# Patient Record
Sex: Male | Born: 1995 | Race: Black or African American | Hispanic: No | Marital: Single | Smoking: Former smoker
Health system: Southern US, Community
[De-identification: ages and names within clinical notes are randomized; demographics above are authoritative.]

---
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
## 2016-05-19 ENCOUNTER — Emergency Department (HOSPITAL_COMMUNITY)
Admission: EM | Admit: 2016-05-19 | Discharge: 2016-05-19 | Disposition: A | Discharge status: Home / Self Care | Payer: Managed Care, Other (non HMO) | Attending: Emergency Medicine | Admitting: Emergency Medicine

## 2016-05-19 ENCOUNTER — Emergency Department (HOSPITAL_COMMUNITY): Payer: Managed Care, Other (non HMO)

## 2016-05-19 ENCOUNTER — Encounter (HOSPITAL_COMMUNITY): Payer: Self-pay | Admitting: Emergency Medicine

## 2016-05-19 DIAGNOSIS — E860 Dehydration: Secondary | ICD-10-CM

## 2016-05-19 DIAGNOSIS — R5383 Other fatigue: Secondary | ICD-10-CM

## 2016-05-19 DIAGNOSIS — J45909 Unspecified asthma, uncomplicated: Secondary | ICD-10-CM | POA: Insufficient documentation

## 2016-05-19 DIAGNOSIS — F17200 Nicotine dependence, unspecified, uncomplicated: Secondary | ICD-10-CM | POA: Insufficient documentation

## 2016-05-19 DIAGNOSIS — Z79899 Other long term (current) drug therapy: Secondary | ICD-10-CM | POA: Insufficient documentation

## 2016-05-19 LAB — RAPID URINE DRUG SCREEN, HOSP PERFORMED
Amphetamines: NOT DETECTED
BENZODIAZEPINES: NOT DETECTED
Barbiturates: NOT DETECTED
COCAINE: POSITIVE — AB
Opiates: NOT DETECTED
Tetrahydrocannabinol: POSITIVE — AB

## 2016-05-19 LAB — CBC WITH DIFFERENTIAL/PLATELET
BASOS ABS: 0 10*3/uL (ref 0.0–0.1)
BASOS PCT: 0 %
EOS PCT: 1 %
Eosinophils Absolute: 0 10*3/uL (ref 0.0–0.7)
HCT: 39.8 % (ref 39.0–52.0)
Hemoglobin: 13.4 g/dL (ref 13.0–17.0)
Lymphocytes Relative: 19 %
Lymphs Abs: 0.8 10*3/uL (ref 0.7–4.0)
MCH: 28.4 pg (ref 26.0–34.0)
MCHC: 33.7 g/dL (ref 30.0–36.0)
MCV: 84.3 fL (ref 78.0–100.0)
MONO ABS: 0.4 10*3/uL (ref 0.1–1.0)
Monocytes Relative: 9 %
Neutro Abs: 2.8 10*3/uL (ref 1.7–7.7)
Neutrophils Relative %: 71 %
PLATELETS: 225 10*3/uL (ref 150–400)
RBC: 4.72 MIL/uL (ref 4.22–5.81)
RDW: 13.2 % (ref 11.5–15.5)
WBC: 4 10*3/uL (ref 4.0–10.5)

## 2016-05-19 LAB — BASIC METABOLIC PANEL
Anion gap: 7 (ref 5–15)
BUN: 16 mg/dL (ref 6–20)
CALCIUM: 8.9 mg/dL (ref 8.9–10.3)
CO2: 26 mmol/L (ref 22–32)
Chloride: 107 mmol/L (ref 101–111)
Creatinine, Ser: 1.05 mg/dL (ref 0.61–1.24)
GFR calc Af Amer: >60 mL/min (ref >60)
GLUCOSE: 97 mg/dL (ref 65–99)
Potassium: 3.6 mmol/L (ref 3.5–5.1)
Sodium: 140 mmol/L (ref 135–145)

## 2016-05-19 NOTE — ED Notes (Signed)
Upon assessment pt sts "I just feel like I'm in a daze and my throat is closing up." PT is speaking in complete sentences and denies SOB. Pt was supposed to start his first day of work today.

## 2016-05-19 NOTE — ED Triage Notes (Signed)
Pt from home c/o feeling dehydrated. Pt sts he had nothing to drink yesterday. Pt had positive orthostatic changes. Pt was tachy for EMS but is now Normal sinus. Pt ambulatory without difficulty. Pt in no distress. Pt has 20 L forearm, fluids have been started en route.

## 2016-05-19 NOTE — Discharge Instructions (Signed)
Your symptoms are most consistent with a mild dehydration. It is important for you to stay well hydrated and drink at least 8, 8 ounce glasses of water or other electro eyebrow solution daily. Please follow-up with your doctor as needed for reevaluation. Return to ED for new or worsening symptoms.

## 2016-05-19 NOTE — ED Provider Notes (Signed)
[facility]EMERGENCY DEPT Provider Note   CSN: [id number] Arrival date & time: 05/19/16  1039     History   Chief Complaint Chief Complaint  Patient presents with  . Dehydration    HPI Mark Carrillo is a 20 y.o. male.  HPI brought in by mom for evaluation of weakness and dizziness. Patient reports he was starting a new job today and at approximately 9:00 AM "felt like I'm in a daze and my heart is pounding". He does report a remote history of asthma, but denies any chest pain, shortness of breath, abdominal pain, other focal numbness or weakness, vision changes. Mom reports that he has had headaches in the past, but nothing today. Patient also admits that he has not had anything to drink since yesterday. Per EMS he was tachycardic and orthostatic, but after small fluid bolus, vital signs have normalized. No fevers, chills, cough or other medical symptoms. Nothing makes problem better or worse. No other modifying factors.  Past Medical History:  Diagnosis Date  . Asthma     There are no active problems to display for this patient.   History reviewed. No pertinent surgical history.     Home Medications    Prior to Admission medications   Medication Sig Start Date End Date Taking? Authorizing Provider  albuterol (PROVENTIL HFA;VENTOLIN HFA) 108 (90 BASE) MCG/ACT inhaler Inhale 2 puffs into the lungs every 6 (six) hours as needed. For shortness of breath.    Historical Provider, MD    Family History No family history on file.  Social History Social History  Substance Use Topics  . Smoking status: Smoker, Current Status Unknown  . Smokeless tobacco: Not on file  . Alcohol use No     Allergies   Fish allergy   Review of Systems Review of Systems A 10 point review of systems was completed and was negative except for pertinent positives and negatives as mentioned in the history of present illness    Physical Exam Updated Vital Signs BP 119/74 (BP Location: Right Arm)    Pulse 79   Temp 98.9 F (37.2 C)   Resp 18   SpO2 98%   Physical Exam  Constitutional: He is oriented to person, place, and time. He appears well-developed and well-nourished. No distress.  Speaking in full sentences. No apparent distress. Nontoxic in appearance.  HENT:  Head: Normocephalic and atraumatic.  Right Ear: External ear normal.  Left Ear: External ear normal.  Nose: Nose normal.  Mouth/Throat: Oropharynx is clear and moist. No oropharyngeal exudate.  Eyes: Conjunctivae and EOM are normal. Pupils are equal, round, and reactive to light. Right eye exhibits no discharge. Left eye exhibits no discharge. No scleral icterus.  Neck: Normal range of motion. No tracheal deviation present.  No meningismus or nuchal rigidity  Cardiovascular: Normal rate, regular rhythm and normal heart sounds.   Pulmonary/Chest: Effort normal and breath sounds normal. No respiratory distress. He has no wheezes. He has no rales.  Abdominal: Soft. There is no tenderness.  Musculoskeletal: Normal range of motion. He exhibits no edema, tenderness or deformity.  Neurological: He is alert and oriented to person, place, and time. No cranial nerve deficit. He exhibits normal muscle tone. Coordination normal.  Cranial nerves III through XII grossly intact. Motor strength is 5/5 in all 4 extremities and sensation is intact to light touch. Completes finger to nose coordination movements without difficulty. No nystagmus. Gait is baseline without ataxia.  Skin: No rash noted. He is not diaphoretic.  Psychiatric:  He has a normal mood and affect. His behavior is normal. Judgment and thought content normal.  Nursing note and vitals reviewed.    ED Treatments / Results  Labs (all labs ordered are listed, but only abnormal results are displayed) Labs Reviewed  BASIC METABOLIC PANEL  CBC WITH DIFFERENTIAL/PLATELET  RAPID URINE DRUG SCREEN, HOSP PERFORMED    EKG  EKG Interpretation  Date/Time:  Wednesday  May 19 2016 11:47:36 EDT Ventricular Rate:  61 PR Interval:    QRS Duration: 77 QT Interval:  425 QTC Calculation: 429 R Axis:   54 Text Interpretation:  Normal sinus rhythm RSR' in V1 or V2, probably normal variant LVH by voltage Baseline wander in lead(s) V1 No old tracing to compare Confirmed by GOLDSTON MD, SCOTT [phone]([id number]) on 05/19/2016 12:41:19 PM       Radiology No results found.  Procedures Procedures (including critical care time)  Medications Ordered in ED Medications - No data to display  Vitals:   05/19/16 1041 05/19/16 1046 05/19/16 1100 05/19/16 1130  BP:  119/74 130/86 128/70  Pulse:  79 82 68  Resp:  18    Temp:  98.9 F (37.2 C)    SpO2: 100% 98% 99% 100%    Initial Impression / Assessment and Plan / ED Course  I have reviewed the triage vital signs and the nursing notes.  Pertinent labs & imaging results that were available during my care of the patient were reviewed by me and considered in my medical decision making (see chart for details).  Clinical Course    Patient with likely very mild dehydration. Overall appears very well, hemodynamically stable and afebrile. He is able to tolerate oral fluids. Mom is very concerned. We will obtain screening labs, EKG and chest x-ray. Offered reassurance. Reassess.  Screening labs, EKG and chest x-ray are all reassuring. Urine rapid drug screen is positive for THC and cocaine--despite patient denying any use earlier. Discuss results with patient and he admits to eating a cookie this morning that may have had marijuana and/or cocaine in it. Discussed cessation of illicit drug use as this can be detrimental to his health and could be contributory to his current symptoms. Encouraged rest, rehydration at home. PCP follow-up. Strict return precautions discussed. Overall appears well and appropriate for outpatient follow-up.  Final Clinical Impressions(s) / ED Diagnoses   Final diagnoses:  Fatigue, unspecified type    Mild dehydration    New Prescriptions New Prescriptions   No medications on file     Joycie PeekBenjamin Timo Hartwig, PA-C 05/19/16 1341    Joycie PeekBenjamin Prospero Mahnke, PA-C 05/19/16 1341    Pricilla LovelessScott Goldston, MD 05/25/16 1719

## 2016-05-19 NOTE — ED Notes (Signed)
Patient transported to X-ray 

## 2016-11-17 ENCOUNTER — Emergency Department (HOSPITAL_COMMUNITY): Payer: Managed Care, Other (non HMO)

## 2016-11-17 ENCOUNTER — Encounter (HOSPITAL_COMMUNITY): Payer: Self-pay | Admitting: *Deleted

## 2016-11-17 ENCOUNTER — Emergency Department (HOSPITAL_COMMUNITY)
Admission: EM | Admit: 2016-11-17 | Discharge: 2016-11-17 | Disposition: A | Discharge status: Home / Self Care | Payer: Managed Care, Other (non HMO) | Attending: Emergency Medicine | Admitting: Emergency Medicine

## 2016-11-17 DIAGNOSIS — F17200 Nicotine dependence, unspecified, uncomplicated: Secondary | ICD-10-CM | POA: Insufficient documentation

## 2016-11-17 DIAGNOSIS — J45909 Unspecified asthma, uncomplicated: Secondary | ICD-10-CM | POA: Insufficient documentation

## 2016-11-17 DIAGNOSIS — R1031 Right lower quadrant pain: Secondary | ICD-10-CM | POA: Insufficient documentation

## 2016-11-17 DIAGNOSIS — Z79899 Other long term (current) drug therapy: Secondary | ICD-10-CM | POA: Insufficient documentation

## 2016-11-17 LAB — LIPASE, BLOOD: LIPASE: 24 U/L (ref 11–51)

## 2016-11-17 LAB — COMPREHENSIVE METABOLIC PANEL
ALBUMIN: 4.3 g/dL (ref 3.5–5.0)
ALK PHOS: 43 U/L (ref 38–126)
ALT: 19 U/L (ref 17–63)
ANION GAP: 5 (ref 5–15)
AST: 17 U/L (ref 15–41)
BUN: 10 mg/dL (ref 6–20)
CALCIUM: 9.5 mg/dL (ref 8.9–10.3)
CO2: 30 mmol/L (ref 22–32)
Chloride: 107 mmol/L (ref 101–111)
Creatinine, Ser: 0.82 mg/dL (ref 0.61–1.24)
GFR calc Af Amer: >60 mL/min (ref >60)
GFR calc non Af Amer: >60 mL/min (ref >60)
Glucose, Bld: 96 mg/dL (ref 65–99)
POTASSIUM: 4.2 mmol/L (ref 3.5–5.1)
SODIUM: 142 mmol/L (ref 135–145)
Total Bilirubin: 0.6 mg/dL (ref 0.3–1.2)
Total Protein: 6.7 g/dL (ref 6.5–8.1)

## 2016-11-17 LAB — CBC
HEMATOCRIT: 41.3 % (ref 39.0–52.0)
HEMOGLOBIN: 14.2 g/dL (ref 13.0–17.0)
MCH: 28.8 pg (ref 26.0–34.0)
MCHC: 34.4 g/dL (ref 30.0–36.0)
MCV: 83.8 fL (ref 78.0–100.0)
Platelets: 222 10*3/uL (ref 150–400)
RBC: 4.93 MIL/uL (ref 4.22–5.81)
RDW: 12.4 % (ref 11.5–15.5)
WBC: 2.7 10*3/uL — ABNORMAL LOW (ref 4.0–10.5)

## 2016-11-17 MED ORDER — NAPROXEN 250 MG PO TABS: 250.0000 mg | ORAL_TABLET | Freq: Two times a day (BID) | ORAL | 0 refills | Status: DC

## 2016-11-17 MED ORDER — IOPAMIDOL (ISOVUE-300) INJECTION 61%
INTRAVENOUS | Status: AC
  Filled 2016-11-17: qty 75

## 2016-11-17 MED ORDER — ACETAMINOPHEN 325 MG PO TABS
650.0000 mg | ORAL_TABLET | Freq: Once | ORAL | Status: AC
  Administered 2016-11-17: 650 mg via ORAL
  Filled 2016-11-17: qty 2

## 2016-11-17 MED ORDER — SODIUM CHLORIDE 0.9 % IV BOLUS (SEPSIS)
1000.0000 mL | Freq: Once | INTRAVENOUS | Status: AC
  Administered 2016-11-17: 1000 mL via INTRAVENOUS

## 2016-11-17 MED ORDER — IOPAMIDOL (ISOVUE-300) INJECTION 61%
75.0000 mL | Freq: Once | INTRAVENOUS | Status: AC | PRN
  Administered 2016-11-17: 75 mL via INTRAVENOUS

## 2016-11-17 NOTE — ED Triage Notes (Addendum)
  Pt complains of lower abdominal pain for the past few days. Pt denies n/v/d or urinary symptoms.

## 2016-11-17 NOTE — ED Provider Notes (Signed)
[facility] Provider Note   CSN: [id number] Arrival date & time: 11/17/16  1345     History   Chief Complaint Chief Complaint  Patient presents with  . Abdominal Pain    HPI Mark Carrillo is a 21 y.o. male.  Mark Carrillo is a 21 y.o. Male who presents to the ED with his mother complaining of right lower quadrant abdominal pain for the past 2 days. She reports he woke up with this pain 2 days ago and is gradually worsened. Reports his pain is worse with lifting up his left leg and his right lower quadrant. He denies previous abdominal surgeries. He's had no nausea, vomiting or diarrhea. No treatments attempted prior to arrival. He reports his pain is worse with lifting his left leg as well as with palpation of his lower abdomen. Last bowel movement was yesterday and was normal. He denies fevers, recent illness, nausea, vomiting, diarrhea, urinary symptoms, penile pain, testicular pain, coughs or rashes.   The history is provided by the patient, a parent and medical records. No language interpreter was used.  Abdominal Pain   Pertinent negatives include fever, diarrhea, nausea, vomiting, constipation, dysuria, frequency and headaches.    Past Medical History:  Diagnosis Date  . Asthma     There are no active problems to display for this patient.   History reviewed. No pertinent surgical history.     Home Medications    Prior to Admission medications   Medication Sig Start Date End Date Taking? Authorizing Provider  naproxen (NAPROSYN) 250 MG tablet Take 1 tablet (250 mg total) by mouth 2 (two) times daily with a meal. 11/17/16   Everlene Farrier, PA-C    Family History No family history on file.  Social History Social History  Substance Use Topics  . Smoking status: Smoker, Current Status Unknown  . Smokeless tobacco: Never Used  . Alcohol use No     Allergies   Fish allergy   Review of Systems Review of Systems  Constitutional: Negative for chills  and fever.  HENT: Negative for congestion and sore throat.   Eyes: Negative for visual disturbance.  Respiratory: Negative for cough and shortness of breath.   Cardiovascular: Negative for chest pain.  Gastrointestinal: Positive for abdominal pain. Negative for abdominal distention, blood in stool, constipation, diarrhea, nausea and vomiting.  Genitourinary: Negative for decreased urine volume, difficulty urinating, dysuria, flank pain, frequency, genital sores, penile pain, testicular pain and urgency.  Musculoskeletal: Negative for back pain and neck pain.  Skin: Negative for rash.  Neurological: Negative for light-headedness and headaches.     Physical Exam Updated Vital Signs BP 129/86 (BP Location: Left Arm)   Pulse 60   Temp 98.5 F (36.9 C) (Oral)   Resp 14   SpO2 100%   Physical Exam  Constitutional: He appears well-developed and well-nourished. No distress.  Nontoxic appearing.  HENT:  Head: Normocephalic and atraumatic.  Mouth/Throat: Oropharynx is clear and moist.  Mucous membranes are moist.  Eyes: Conjunctivae are normal. Pupils are equal, round, and reactive to light. Right eye exhibits no discharge. Left eye exhibits no discharge.  Neck: Neck supple.  Cardiovascular: Normal rate, regular rhythm, normal heart sounds and intact distal pulses.  Exam reveals no gallop and no friction rub.   No murmur heard. Pulmonary/Chest: Effort normal and breath sounds normal. No respiratory distress. He has no wheezes. He has no rales.  Abdominal: Soft. Bowel sounds are normal. He exhibits no distension and no mass. There is  tenderness. There is rebound. There is no guarding. No hernia.  Abdomen is soft. Bowel sounds are present. Patient has right lower quadrant tenderness to palpation. Positive psoas sign. Negative obturator sign. No CVA or flank tenderness. No upper abdominal tenderness to palpation. No guarding.  Musculoskeletal: He exhibits no edema.  Lymphadenopathy:    He  has no cervical adenopathy.  Neurological: He is alert. Coordination normal.  Skin: Skin is warm and dry. Capillary refill takes less than 2 seconds. No rash noted. He is not diaphoretic. No erythema. No pallor.  Psychiatric: He has a normal mood and affect. His behavior is normal.  Nursing note and vitals reviewed.    ED Treatments / Results  Labs (all labs ordered are listed, but only abnormal results are displayed) Labs Reviewed  CBC - Abnormal; Notable for the following:       Result Value   WBC 2.7 (*)    All other components within normal limits  LIPASE, BLOOD  COMPREHENSIVE METABOLIC PANEL    EKG  EKG Interpretation None       Radiology Ct Abdomen Pelvis W Contrast  Result Date: 11/17/2016 CLINICAL DATA:  Initial evaluation for acute right lower quadrant abdominal pain. EXAM: CT ABDOMEN AND PELVIS WITH CONTRAST TECHNIQUE: Multidetector CT imaging of the abdomen and pelvis was performed using the standard protocol following bolus administration of intravenous contrast. CONTRAST:  75mL ISOVUE-300 IOPAMIDOL (ISOVUE-300) INJECTION 61% COMPARISON:  None. FINDINGS: Lower chest: Visualized lung bases are clear. Hepatobiliary: Liver demonstrates a normal contrast enhanced appearance. Gallbladder within normal limits. No biliary dilatation. Pancreas: Pancreas within normal limits. Spleen: Spleen within normal limits. Adrenals/Urinary Tract: Adrenal glands are normal. Kidneys equal in size with symmetric enhancement. Subcentimeter hypodensity within left kidney too small the characterize, but statistically likely reflects a small cyst. No nephrolithiasis, hydronephrosis, or focal enhancing renal mass. No hydroureter. Bladder within normal limits. Stomach/Bowel: Stomach within normal limits. No evidence for bowel obstruction. No other acute inflammatory changes seen about the bowels. Vascular/Lymphatic: Normal intravascular enhancement seen throughout the intra-abdominal aorta and its  branch vessels. No adenopathy. Reproductive: Prostate within normal limits. Other: No free air or fluid. Musculoskeletal: No acute osseus abnormality. No worrisome lytic or blastic osseous lesions. IMPRESSION: 1. No CT evidence for acute intra-abdominal or pelvic process. 2. Normal appendix. Electronically Signed   By: Rise Mu M.D.   On: 11/17/2016 18:24    Procedures Procedures (including critical care time)  Medications Ordered in ED Medications  iopamidol (ISOVUE-300) 61 % injection (not administered)  acetaminophen (TYLENOL) tablet 650 mg (not administered)  sodium chloride 0.9 % bolus 1,000 mL (1,000 mLs Intravenous New Bag/Given 11/17/16 1750)  iopamidol (ISOVUE-300) 61 % injection 75 mL (75 mLs Intravenous Contrast Given 11/17/16 1807)     Initial Impression / Assessment and Plan / ED Course  I have reviewed the triage vital signs and the nursing notes.  Pertinent labs & imaging results that were available during my care of the patient were reviewed by me and considered in my medical decision making (see chart for details).    This  is a 21 y.o. Male who presents to the ED with his mother complaining of right lower quadrant abdominal pain for the past 2 days. She reports he woke up with this pain 2 days ago and is gradually worsened. Reports his pain is worse with lifting up his left leg and his right lower quadrant. He denies previous abdominal surgeries. He's had no nausea, vomiting or diarrhea. No treatments  attempted prior to arrival. He reports his pain is worse with lifting his left leg as well as with palpation of his lower abdomen. Last bowel movement was yesterday and was normal. On exam patient is afebrile nontoxic appearing. His abdomen is soft and his right lower quadrant tenderness to palpation. Positive psoas sign on exam. Negative obturator sign. No CVA or flank tenderness. Lipase is within normal limits. CMP is unremarkable. CBC is unremarkable only for a white  count 2700. Due to patient's right lower quadrant abdominal pain and positive psoas sign and a CT abdomen and pelvis with contrast was obtained. No evidence appendicitis on exam. No evidence for acute intra-abdominal or pelvic process on CT scan. Reevaluation patient is tolerating by mouth. Patient provided with Tylenol prior to discharge. Suspect abdominal wall pain. We'll discharge with naproxen and have him follow-up closely primary care. I discussed return precautions. I advised the patient to follow-up with their primary care provider this week. I advised the patient to return to the emergency department with new or worsening symptoms or new concerns. The patient and his mother verbalized understanding and agreement with plan.   Final Clinical Impressions(s) / ED Diagnoses   Final diagnoses:  Right lower quadrant abdominal pain    New Prescriptions New Prescriptions   NAPROXEN (NAPROSYN) 250 MG TABLET    Take 1 tablet (250 mg total) by mouth 2 (two) times daily with a meal.     Everlene Farrier, PA-C 11/17/16 1913    Marily Memos, MD 11/17/16 2223

## 2017-04-13 ENCOUNTER — Ambulatory Visit (HOSPITAL_COMMUNITY)
Admission: EM | Admit: 2017-04-13 | Discharge: 2017-04-13 | Disposition: A | Discharge status: Home / Self Care | Payer: Self-pay | Attending: Family Medicine | Admitting: Family Medicine

## 2017-04-13 ENCOUNTER — Encounter (HOSPITAL_COMMUNITY): Payer: Self-pay | Admitting: *Deleted

## 2017-04-13 DIAGNOSIS — R42 Dizziness and giddiness: Secondary | ICD-10-CM

## 2017-04-13 DIAGNOSIS — R0602 Shortness of breath: Secondary | ICD-10-CM

## 2017-04-13 DIAGNOSIS — Z711 Person with feared health complaint in whom no diagnosis is made: Secondary | ICD-10-CM

## 2017-04-13 NOTE — ED Provider Notes (Signed)
  [facility]   [id number] 04/13/17 Arrival Time: 1516  ASSESSMENT & PLAN:  1. Worried well     Meds ordered this encounter  Medications  . metroNIDAZOLE (FLAGYL) 250 MG tablet    Sig: Take 250 mg by mouth 3 (three) times daily.   Return as needed  Reviewed expectations re: course of current medical issues. Questions answered. Outlined signs and symptoms indicating need for more acute intervention. Patient verbalized understanding. After Visit Summary given.   SUBJECTIVE:  Christella HartiganMicahl Mori is a 21 y.o. male who presents with complaint of Possible allergic reaction. Is being treated for STD, received a 2 g dose of metronidazole from the health department 2 days ago. States he experienced nausea, stomach cramping, and the feeling of being unwell. Symptoms have started to improve. No difficulty swallowing, no difficulty breathing, no wheezing, hives, itching, or other symptoms.  ROS: As per HPI.   OBJECTIVE:  Vitals:   04/13/17 1557  BP: 122/78  Pulse: 70  Resp: 16  Temp: 98.5 F (36.9 C)  TempSrc: Oral  SpO2: 100%    General appearance: alert; no distress Eyes: PERRLA; EOMI; conjunctiva normal HENT: normocephalic; atraumatic; TMs normal; nasal mucosa normal; oral mucosa normal Neck: supple Lungs: clear to auscultation bilaterally Heart: regular rate and rhythm Abdomen: soft, non-tender; bowel sounds normal; no masses or organomegaly; no guarding or rebound tenderness Back: no CVA tenderness Extremities: no cyanosis or edema; symmetrical with no gross deformities Skin: warm and dry Neurologic: normal gait; normal symmetric reflexes Psychological: alert and cooperative; normal mood and affect  Labs: Labs Reviewed - No data to display  Imaging: No results found.  Allergies  Allergen Reactions  . Fish Allergy Anaphylaxis and Rash    Past Medical History:  Diagnosis Date  . Asthma    Social History   Social History  . Marital status: Single   Spouse name: N/A  . Number of children: N/A  . Years of education: N/A   Occupational History  . Not on file.   Social History Main Topics  . Smoking status: Smoker, Current Status Unknown  . Smokeless tobacco: Never Used  . Alcohol use No  . Drug use: No  . Sexual activity: Not on file   Other Topics Concern  . Not on file   Social History Narrative  . No narrative on file   History reviewed. No pertinent family history. History reviewed. No pertinent surgical history.   Dorena BodoKennard, Jacora Hopkins, NP 04/13/17 1704

## 2017-04-13 NOTE — ED Triage Notes (Addendum)
Patient reports he is taking flagyl, for STD, and states that he thinks he is having an allergic reaction. Reports sob and feeling like he is going to pass out. Patient appears in no distress and is speaking in complete sentences. No hives noted.

## 2017-04-13 NOTE — Discharge Instructions (Signed)
Return to clinic as needed if symptoms worsen

## 2018-02-19 IMAGING — CT CT ABD-PELV W/ CM
2 of 4 series · 16 of 46 positions shown, 18 images · IV contrast (iopamidol)
Comparison: None.

CLINICAL DATA: Initial evaluation for acute right lower quadrant
abdominal pain.

EXAM:
CT ABDOMEN AND PELVIS WITH CONTRAST
TECHNIQUE: Multidetector CT imaging of the abdomen and pelvis was performed
using the standard protocol following bolus administration of
intravenous contrast.
CONTRAST:  75mL BBJVYR-466 IOPAMIDOL (BBJVYR-466) INJECTION 61%

[Series 2: abd/pel with · axial · 0.66mm/px · z∈[-354,+16]mm · 13 of 84 slices shown, 15 images]
[im 5/84  soft-tissue]
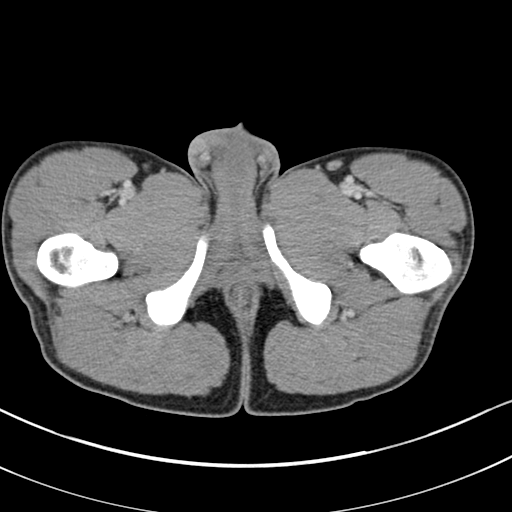
[im 5/84  bone]
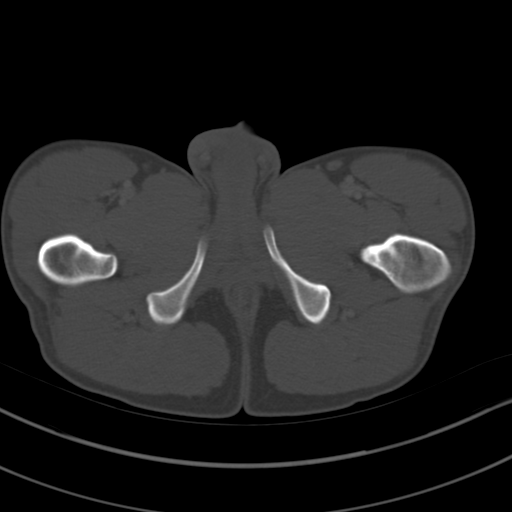
[im 13/84  soft-tissue]
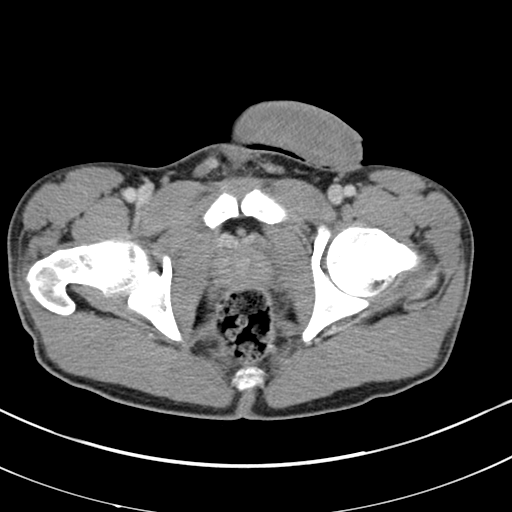
[im 17/84  soft-tissue]
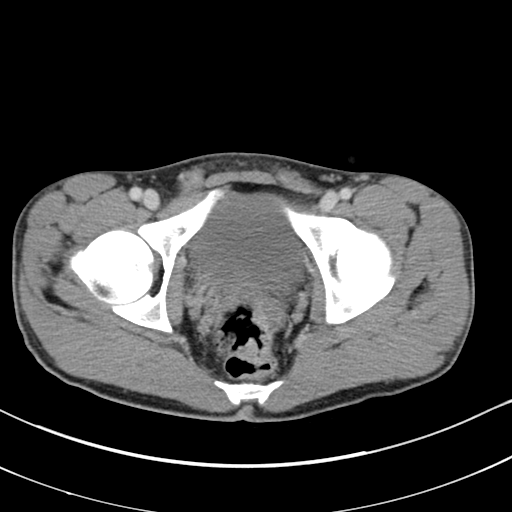
[im 25/84  soft-tissue]
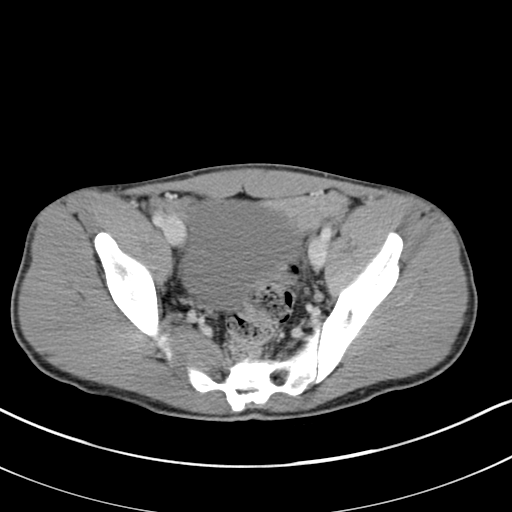
[im 30/84  soft-tissue]
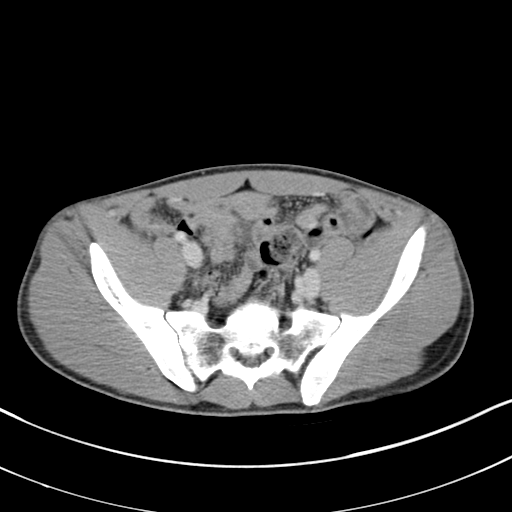
[im 38/84  soft-tissue]
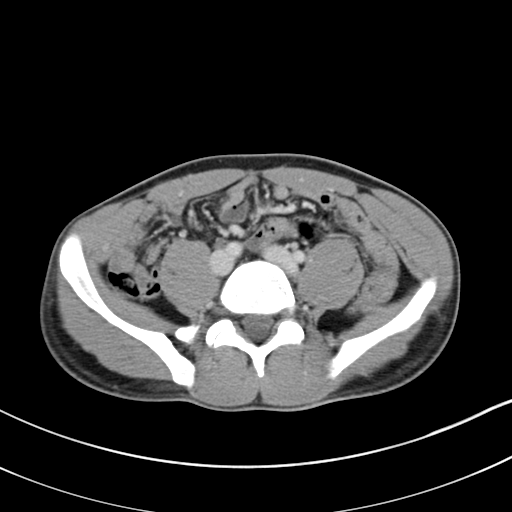
[im 42/84  soft-tissue]
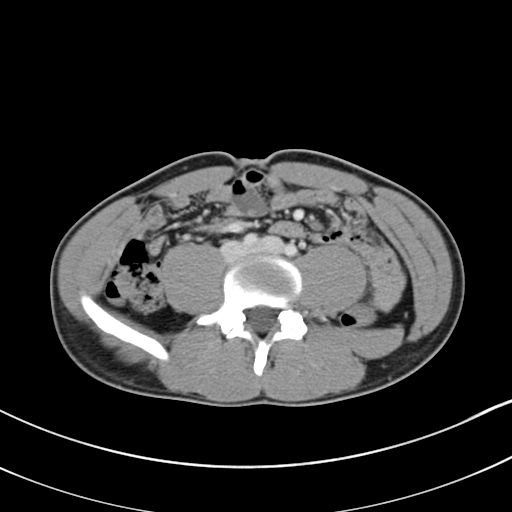
[im 46/84  soft-tissue]
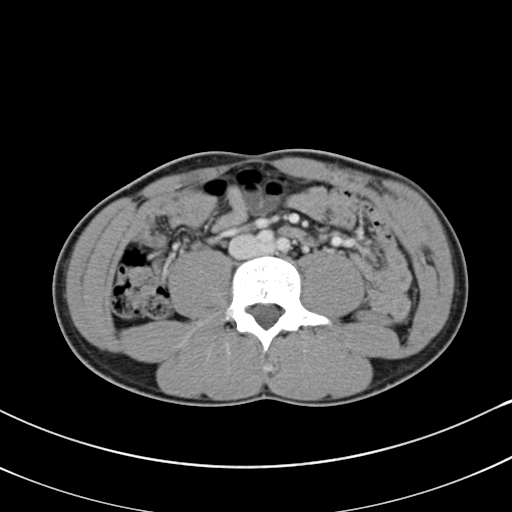
[im 54/84  soft-tissue]
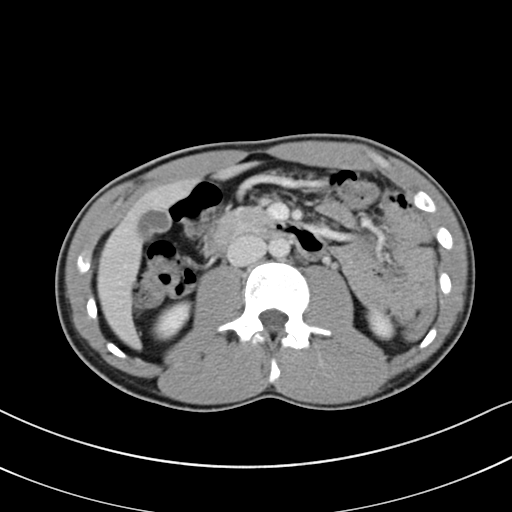
[im 54/84  bone]
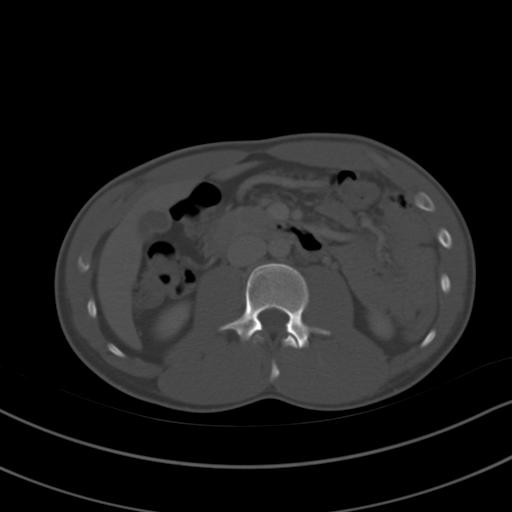
[im 59/84  soft-tissue]
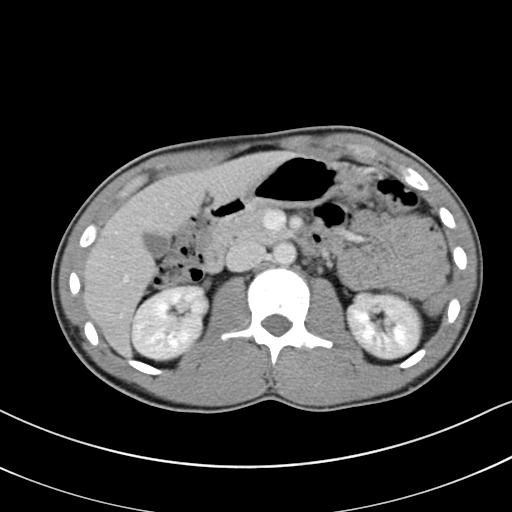
[im 67/84  soft-tissue]
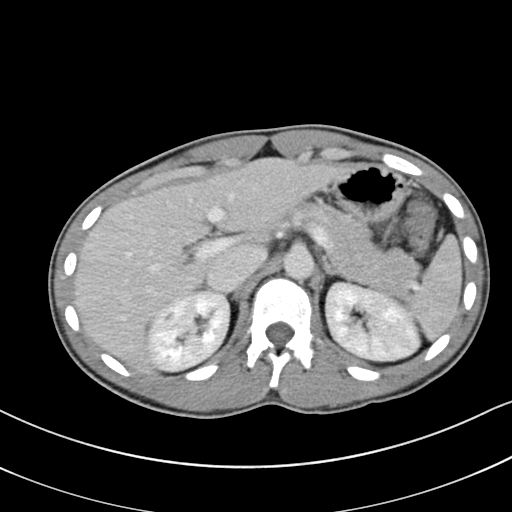
[im 71/84  soft-tissue]
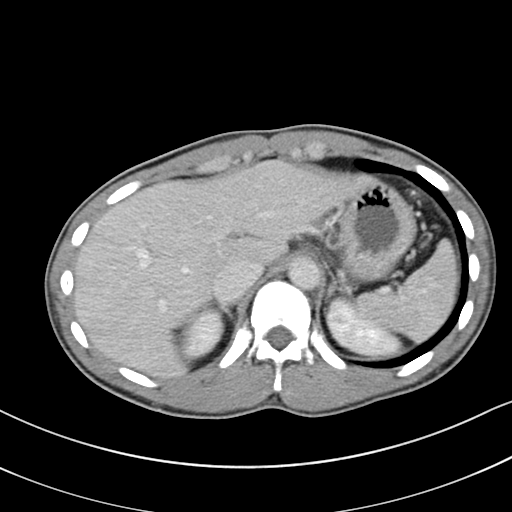
[im 79/84  soft-tissue]
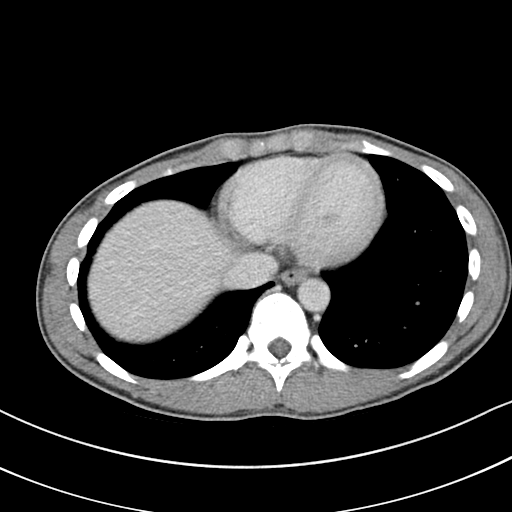

[Series 5: coronal a/|p · coronal · 0.69mm/px · 3 of 99 slices shown]
[im 33/99  soft-tissue]
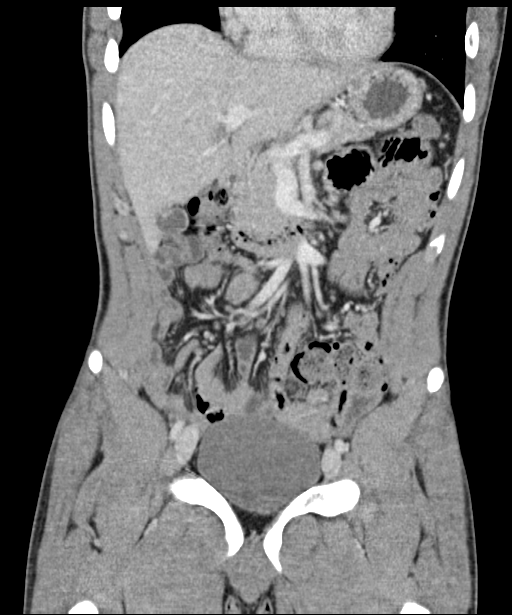
[im 44/99  soft-tissue]
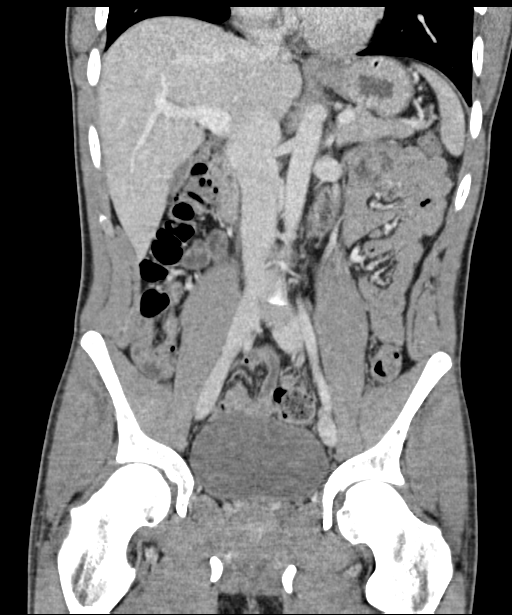
[im 55/99  soft-tissue]
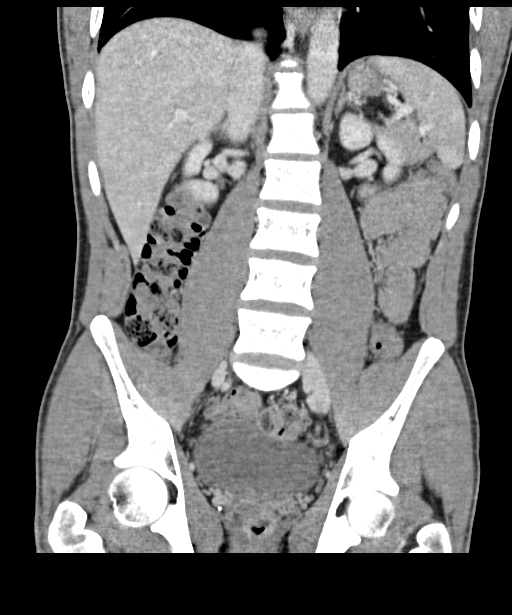

[16 of 46 positions shown; findings below may reference images not displayed]

FINDINGS: Lower chest: Visualized lung bases are clear.

Hepatobiliary: Liver demonstrates a normal contrast enhanced
appearance. Gallbladder within normal limits. No biliary dilatation.

Pancreas: Pancreas within normal limits.

Spleen: Spleen within normal limits.

Adrenals/Urinary Tract: Adrenal glands are normal. Kidneys equal in
size with symmetric enhancement. Subcentimeter hypodensity within
left kidney too small the characterize, but statistically likely
reflects a small cyst. No nephrolithiasis, hydronephrosis, or focal
enhancing renal mass. No hydroureter. Bladder within normal limits.

Stomach/Bowel: Stomach within normal limits. No evidence for bowel
obstruction. No other acute inflammatory changes seen about the
bowels.

Vascular/Lymphatic: Normal intravascular enhancement seen throughout
the intra-abdominal aorta and its branch vessels. No adenopathy.

Reproductive: Prostate within normal limits.

Other: No free air or fluid.

Musculoskeletal: No acute osseus abnormality. No worrisome lytic or
blastic osseous lesions.
IMPRESSION: 1. No CT evidence for acute intra-abdominal or pelvic process.
2. Normal appendix.

## 2019-02-18 ENCOUNTER — Emergency Department (HOSPITAL_COMMUNITY): Payer: Self-pay

## 2019-02-18 ENCOUNTER — Emergency Department (HOSPITAL_COMMUNITY)
Admission: EM | Admit: 2019-02-18 | Discharge: 2019-02-19 | Disposition: A | Discharge status: Home / Self Care | Payer: Self-pay | Attending: Emergency Medicine | Admitting: Emergency Medicine

## 2019-02-18 ENCOUNTER — Other Ambulatory Visit: Payer: Self-pay

## 2019-02-18 ENCOUNTER — Encounter (HOSPITAL_COMMUNITY): Payer: Self-pay

## 2019-02-18 DIAGNOSIS — W109XXA Fall (on) (from) unspecified stairs and steps, initial encounter: Secondary | ICD-10-CM | POA: Insufficient documentation

## 2019-02-18 DIAGNOSIS — S61309A Unspecified open wound of unspecified finger with damage to nail, initial encounter: Secondary | ICD-10-CM

## 2019-02-18 DIAGNOSIS — Z79899 Other long term (current) drug therapy: Secondary | ICD-10-CM | POA: Insufficient documentation

## 2019-02-18 DIAGNOSIS — J45909 Unspecified asthma, uncomplicated: Secondary | ICD-10-CM | POA: Insufficient documentation

## 2019-02-18 DIAGNOSIS — Y999 Unspecified external cause status: Secondary | ICD-10-CM | POA: Insufficient documentation

## 2019-02-18 DIAGNOSIS — Y929 Unspecified place or not applicable: Secondary | ICD-10-CM | POA: Insufficient documentation

## 2019-02-18 DIAGNOSIS — S61306A Unspecified open wound of right little finger with damage to nail, initial encounter: Secondary | ICD-10-CM | POA: Insufficient documentation

## 2019-02-18 DIAGNOSIS — F17200 Nicotine dependence, unspecified, uncomplicated: Secondary | ICD-10-CM | POA: Insufficient documentation

## 2019-02-18 DIAGNOSIS — Y939 Activity, unspecified: Secondary | ICD-10-CM | POA: Insufficient documentation

## 2019-02-18 MED ORDER — HYDROCODONE-ACETAMINOPHEN 5-325 MG PO TABS
2.0000 | ORAL_TABLET | Freq: Once | ORAL | Status: AC
Start: 2019-02-18 — End: 2019-02-18
  Administered 2019-02-18: 2 via ORAL
  Filled 2019-02-18: qty 2

## 2019-02-18 MED ORDER — LIDOCAINE HCL (PF) 1 % IJ SOLN
30.0000 mL | Freq: Once | INTRAMUSCULAR | Status: DC
  Filled 2019-02-18: qty 30

## 2019-02-18 MED ORDER — LIDOCAINE HCL 2 % IJ SOLN
5.0000 mL | Freq: Once | INTRAMUSCULAR | Status: DC
  Filled 2019-02-18: qty 20

## 2019-02-18 NOTE — ED Provider Notes (Signed)
[facility]   CSN: [id number] Arrival date & time: 02/18/19  2221     History   Chief Complaint Chief Complaint  Patient presents with  . Finger Injury    HPI Mark Carrillo is a 23 y.o. male.     Patient presents to the emergency department with a chief complaint of finger injury.  He states that he tripped and fell down some stairs and injured his right small finger.  He states that it peeled back his fingernail and some of the surrounding skin.  He rates his pain is moderate.  He denies any other associated injuries.  It is worsened with palpation and movement.  The history is provided by the patient. No language interpreter was used.    Past Medical History:  Diagnosis Date  . Asthma     There are no active problems to display for this patient.   History reviewed. No pertinent surgical history.      Home Medications    Prior to Admission medications   Medication Sig Start Date End Date Taking? Authorizing Provider  metroNIDAZOLE (FLAGYL) 250 MG tablet Take 250 mg by mouth 3 (three) times daily.    [provider]  naproxen (NAPROSYN) 250 MG tablet Take 1 tablet (250 mg total) by mouth 2 (two) times daily with a meal. 11/17/16   Everlene Farrieransie, William, PA-C    Family History No family history on file.  Social History Social History   Tobacco Use  . Smoking status: Smoker, Current Status Unknown  . Smokeless tobacco: Never Used  Substance Use Topics  . Alcohol use: No  . Drug use: No     Allergies   Fish allergy   Review of Systems Review of Systems  All other systems reviewed and are negative.    Physical Exam Updated Vital Signs BP 117/77 (BP Location: Right Arm)   Pulse 92   Temp 98.2 F (36.8 C) (Oral)   Resp 14   SpO2 99%   Physical Exam Vitals signs and nursing note reviewed.  Constitutional:      Appearance: He is well-developed.  HENT:     Head: Normocephalic and  atraumatic.  Eyes:     Conjunctiva/sclera: Conjunctivae normal.  Neck:     Musculoskeletal: Neck supple.  Cardiovascular:     Rate and Rhythm: Normal rate and regular rhythm.     Heart sounds: No murmur.  Pulmonary:     Effort: Pulmonary effort is normal. No respiratory distress.     Breath sounds: Normal breath sounds.  Abdominal:     Palpations: Abdomen is soft.     Tenderness: There is no abdominal tenderness.  Musculoskeletal:     Comments: Laceration and avulsion of right fifth fingernail and surrounding soft tissue   Skin:    General: Skin is warm and dry.  Neurological:     Mental Status: He is alert and oriented to person, place, and time.  Psychiatric:        Mood and Affect: Mood normal.        Behavior: Behavior normal.        Thought Content: Thought content normal.        Judgment: Judgment normal.      ED Treatments / Results  Labs (all labs ordered are listed, but only abnormal results are displayed) Labs Reviewed - No data to display  EKG None  Radiology No results found.  Procedures .Nail Removal  Date/Time: 02/19/2019 12:49  AM Performed by: Montine Circle, PA-C Authorized by: Montine Circle, PA-C   Consent:    Consent obtained:  Verbal   Consent given by:  Patient   Risks discussed:  Bleeding, infection, pain and permanent nail deformity   Alternatives discussed:  No treatment, referral and observation Location:    Hand:  R small finger Pre-procedure details:    Skin preparation:  Betadine Anesthesia (see MAR for exact dosages):    Anesthesia method:  Nerve block   Block needle gauge:  25 G   Block anesthetic:  Lidocaine 1% w/o epi   Block technique:  Ring block   Block injection procedure:  Anatomic landmarks identified, introduced needle, incremental injection and negative aspiration for blood   Block outcome:  Anesthesia achieved Nail Removal:    Nail removed:  Complete   Nail bed repaired: no     Removed nail replaced and  anchored: no   Post-procedure details:    Dressing:  Non-adhesive packing strip   Patient tolerance of procedure:  Tolerated well, no immediate complications   (including critical care time)  Medications Ordered in ED Medications  lidocaine (XYLOCAINE) 2 % (with pres) injection 100 mg (has no administration in time range)  lidocaine (PF) (XYLOCAINE) 1 % injection 30 mL (has no administration in time range)     Initial Impression / Assessment and Plan / ED Course  I have reviewed the triage vital signs and the nursing notes.  Pertinent labs & imaging results that were available during my care of the patient were reviewed by me and considered in my medical decision making (see chart for details).        Patient with injury to fingernail.  Lane films are negative.  After nerve block, the wound was explored.  The fingernail was completely avulsed and was only hanging on by a small amount of skin.  This was removed.  There was no significant injury or laceration to the nailbed.  Patient requested that the nail be removed completely and not splinted or re-anchored.  Final Clinical Impressions(s) / ED Diagnoses   Final diagnoses:  Avulsion of fingernail, initial encounter    ED Discharge Orders    None       Montine Circle, PA-C 02/19/19 0052    Fatima Blank, MD 02/20/19 [phone]

## 2019-02-18 NOTE — ED Triage Notes (Signed)
Pt reports he fell down some stairs earlier today, pt injured his right small finger. Bleeding controlled, nail raised.

## 2019-02-19 NOTE — ED Notes (Signed)
Report from Eldridge Scot, RN- pt awaiting PA for suture repair.

## 2020-05-22 IMAGING — CR RIGHT LITTLE FINGER 2+V
3 series · 3 of 3 positions shown · non-contrast
Comparison: None.

SOFT TISSUE IRREGULARITY WITHOUT ACUTE BONY ABNORMALITY.  CLINICAL DATA:
Fall downstairs with fifth digit pain, initial encounter

EXAM:
RIGHT LITTLE FINGER 2+V

[finger ap]
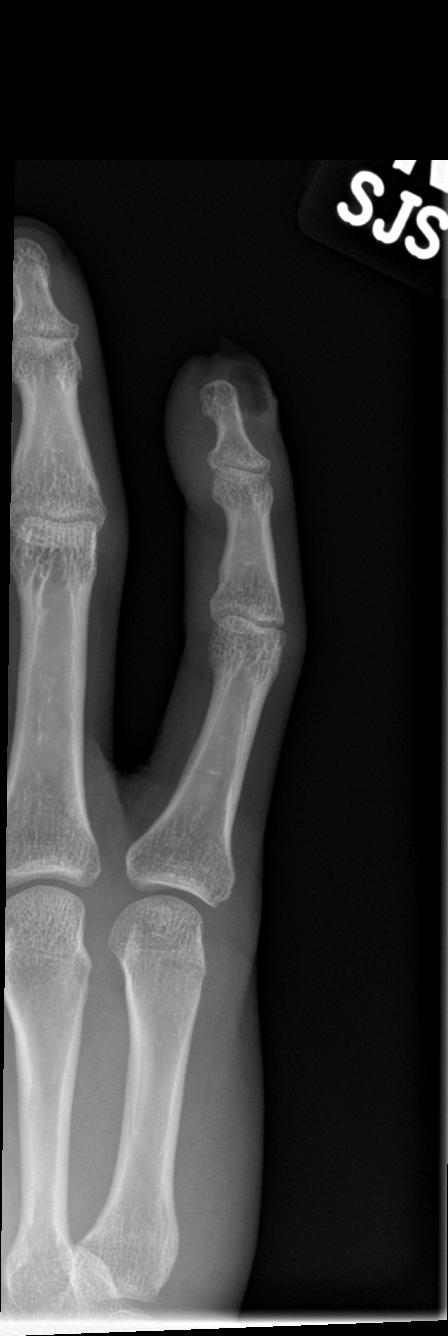

[finger obl]
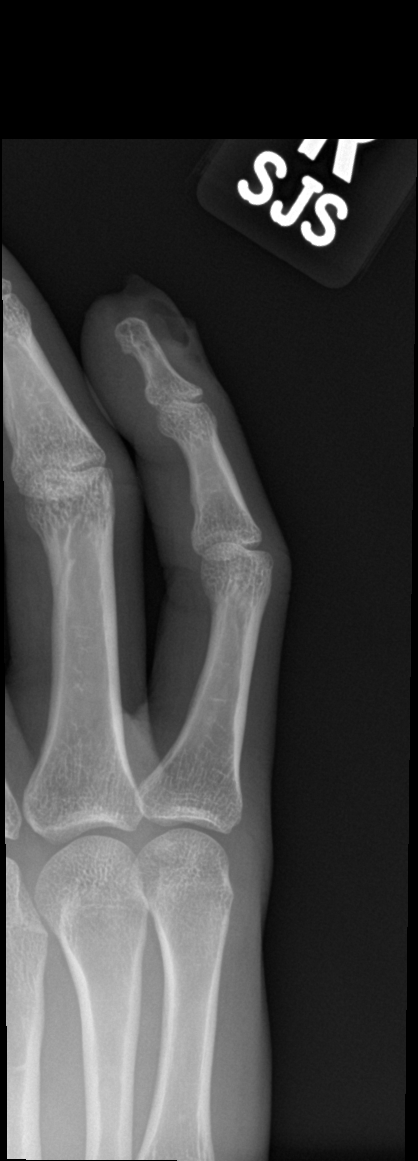

[finger lat]
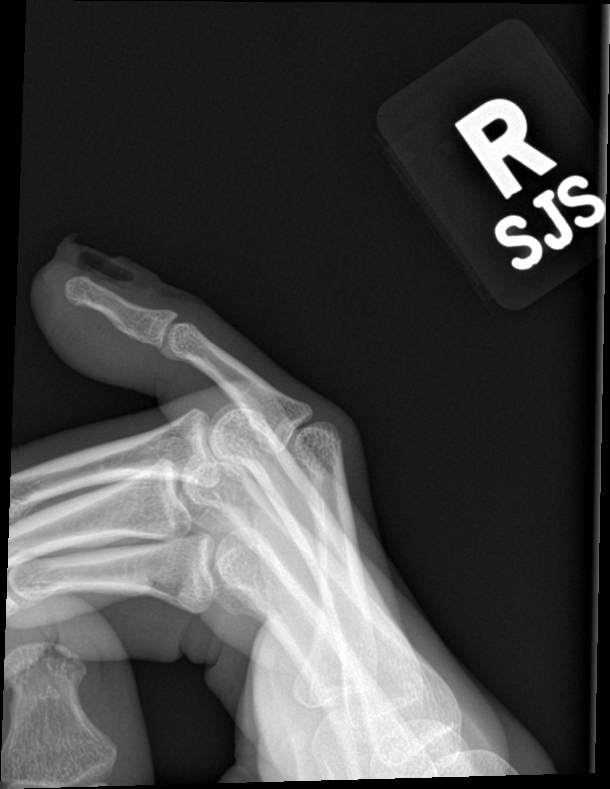

[3 of 3 positions shown; findings below may reference images not displayed]

FINDINGS: Considerable soft tissue irregularity is noted in the distal aspect
of the fifth digit. No bony fracture or radiopaque foreign body is
seen.
IMPRESSION: Negative.

## 2021-05-02 ENCOUNTER — Other Ambulatory Visit: Payer: Self-pay

## 2021-05-02 ENCOUNTER — Encounter (HOSPITAL_COMMUNITY): Payer: Self-pay | Admitting: Emergency Medicine

## 2021-05-02 ENCOUNTER — Emergency Department (HOSPITAL_COMMUNITY)
Admission: EM | Admit: 2021-05-02 | Discharge: 2021-05-02 | Disposition: A | Discharge status: Home / Self Care | Payer: Self-pay | Attending: Emergency Medicine | Admitting: Emergency Medicine

## 2021-05-02 DIAGNOSIS — R300 Dysuria: Secondary | ICD-10-CM | POA: Insufficient documentation

## 2021-05-02 DIAGNOSIS — R369 Urethral discharge, unspecified: Secondary | ICD-10-CM | POA: Insufficient documentation

## 2021-05-02 DIAGNOSIS — J45909 Unspecified asthma, uncomplicated: Secondary | ICD-10-CM | POA: Insufficient documentation

## 2021-05-02 LAB — URINALYSIS, COMPLETE (UACMP) WITH MICROSCOPIC
Bilirubin Urine: NEGATIVE
Glucose, UA: NEGATIVE mg/dL
Hgb urine dipstick: NEGATIVE
Nitrite: NEGATIVE
Protein, ur: NEGATIVE mg/dL
Specific Gravity, Urine: >1.03 — ABNORMAL HIGH (ref 1.005–1.030)
pH: 6 (ref 5.0–8.0)

## 2021-05-02 MED ORDER — DOXYCYCLINE HYCLATE 100 MG PO TABS: 100.0000 mg | ORAL_TABLET | Freq: Two times a day (BID) | ORAL | 0 refills | Status: DC

## 2021-05-02 MED ORDER — LIDOCAINE HCL (PF) 1 % IJ SOLN
1.0000 mL | Freq: Once | INTRAMUSCULAR | Status: AC
  Administered 2021-05-02: 1 mL
  Filled 2021-05-02: qty 30

## 2021-05-02 MED ORDER — CEFTRIAXONE SODIUM 1 G IJ SOLR
500.0000 mg | Freq: Once | INTRAMUSCULAR | Status: AC
  Administered 2021-05-02: 500 mg via INTRAMUSCULAR
  Filled 2021-05-02: qty 10

## 2021-05-02 NOTE — ED Triage Notes (Signed)
Patient arriving via [employer] under IVC by mother. She reports patient has not been eating or drinking in several weeks, has been pulling his hair out, and will not respond to her.

## 2021-05-02 NOTE — Discharge Instructions (Addendum)
There is a prescription for antibiotics attached.  Take as prescribed.  Avoid sexual activity until you and your partner/s are fully treated.  There are cultures for sexually transmitted bacteria that were sent to the lab.  These take a day or 2 to result.  Please follow-up with your primary care doctor for results and to ensure resolution of symptoms.  If you need to establish primary care doctor, call the number on this paperwork.

## 2021-05-02 NOTE — ED Notes (Addendum)
Attempted to move patient into room for MD to do examination, unable to at this time. Will medicate patient when room is available for privacy.

## 2021-05-02 NOTE — ED Notes (Addendum)
Pt's belonging have been placed behind nurses station of [address]. In a white bag he had a pink sweater with matching sweat pants, shirt, shoes, phone, cable, charger and block.

## 2021-05-02 NOTE — ED Provider Notes (Signed)
[facility]-EMERGENCY DEPT Provider Note   CSN: [id number] Arrival date & time: 05/02/21  1757     History Chief Complaint  Patient presents with   Dysuria    Mark Carrillo is a 25 y.o. male.  HPI Patient presents to the ED, accompanied by law enforcement, under IVC by mother.  He has reportedly not been eating or drinking in weeks, pulling his hair out, and not responding to her.  Patient denies all of these claims.  He states that he has been in his normal state of mental and physical health.  He states that he does eat his normal amount.  He described ordering [employer] this morning.  IVC paperwork also reported a weight loss.  Patient states that he is at his normal weight.  He recently moved back in with his mother due to not having any other good options of places to stay.  He describes his mother as mentally unwell.  He states that this puts him in a difficult spot because it is his mother and he loves her.  He states that he was caught completely off guard when he was shown the IVC paperwork today.  At the time it was served to him, he was just in his room playing video games.  He was not aware that someone could even do that to him.  He denies any SI, HI, AVH.  He denies any physical complaints at this time.  Patient subsequently did endorse some penile discharge that he states has been present for the past month.  He denies any testicular pain.  He denies any skin lesions.  He does have some discomfort with urination.    Past Medical History:  Diagnosis Date   Asthma     There are no problems to display for this patient.   History reviewed. No pertinent surgical history.     History reviewed. No pertinent family history.  Social History   Tobacco Use   Smoking status: Smoker, Current Status Unknown   Smokeless tobacco: Never  Substance Use Topics   Alcohol use: No   Drug use: No    Home Medications Prior to Admission medications    Medication Sig Start Date End Date Taking? Authorizing Provider  doxycycline (VIBRA-TABS) 100 MG tablet Take 1 tablet (100 mg total) by mouth 2 (two) times daily. 05/02/21   Gloris Manchester, MD  metroNIDAZOLE (FLAGYL) 250 MG tablet Take 250 mg by mouth 3 (three) times daily.    [provider]  naproxen (NAPROSYN) 250 MG tablet Take 1 tablet (250 mg total) by mouth 2 (two) times daily with a meal. 11/17/16   Everlene Farrier, PA-C    Allergies    Fish allergy  Review of Systems   Review of Systems  Constitutional:  Negative for activity change, appetite change, chills, fatigue, fever and unexpected weight change.  HENT:  Negative for ear pain, sore throat and trouble swallowing.   Eyes:  Negative for pain and visual disturbance.  Respiratory:  Negative for cough, chest tightness and shortness of breath.   Cardiovascular:  Negative for chest pain and palpitations.  Gastrointestinal:  Negative for abdominal pain, diarrhea, nausea and vomiting.  Genitourinary:  Positive for dysuria and penile discharge. Negative for flank pain, genital sores, penile pain and testicular pain.  Musculoskeletal:  Negative for arthralgias, back pain, joint swelling, myalgias and neck pain.  Skin:  Negative for color change and rash.  Neurological:  Negative for dizziness, seizures, syncope, facial asymmetry,  weakness, light-headedness, numbness and headaches.  Hematological:  Does not bruise/bleed easily.  Psychiatric/Behavioral:  Negative for agitation, confusion, decreased concentration, dysphoric mood, hallucinations, self-injury, sleep disturbance and suicidal ideas. The patient is not nervous/anxious.   All other systems reviewed and are negative.  Physical Exam Updated Vital Signs BP (!) 136/101 (BP Location: Right Arm)   Pulse 78   Temp 98.4 F (36.9 C) (Oral)   Resp 16   SpO2 98%   Physical Exam Vitals and nursing note reviewed. Exam conducted with a chaperone present.  Constitutional:       General: He is not in acute distress.    Appearance: Normal appearance. He is well-developed and normal weight. He is not ill-appearing, toxic-appearing or diaphoretic.  HENT:     Head: Normocephalic and atraumatic.     Right Ear: External ear normal.     Nose: Nose normal.     Mouth/Throat:     Mouth: Mucous membranes are moist.     Pharynx: Oropharynx is clear.  Eyes:     General: No scleral icterus.    Extraocular Movements: Extraocular movements intact.     Conjunctiva/sclera: Conjunctivae normal.  Cardiovascular:     Rate and Rhythm: Normal rate and regular rhythm.     Heart sounds: No murmur heard. Pulmonary:     Effort: Pulmonary effort is normal. No respiratory distress.     Breath sounds: Normal breath sounds.  Abdominal:     Palpations: Abdomen is soft.     Tenderness: There is no abdominal tenderness.  Genitourinary:    Pubic Area: No rash.      Penis: Discharge present.      Testes: Normal.        Right: Tenderness or swelling not present.        Left: Tenderness or swelling not present.  Musculoskeletal:        General: No swelling or tenderness.     Cervical back: Normal range of motion and neck supple.     Right lower leg: No edema.     Left lower leg: No edema.  Skin:    General: Skin is warm and dry.     Capillary Refill: Capillary refill takes less than 2 seconds.     Coloration: Skin is not jaundiced or pale.  Neurological:     General: No focal deficit present.     Mental Status: He is alert and oriented to person, place, and time.     Cranial Nerves: No cranial nerve deficit.     Sensory: No sensory deficit.     Motor: No weakness.     Coordination: Coordination normal.     Gait: Gait normal.  Psychiatric:        Attention and Perception: Attention and perception normal. He does not perceive auditory or visual hallucinations.        Mood and Affect: Mood and affect normal. Mood is not anxious or depressed. Affect is not labile, blunt, flat or angry.         Speech: Speech normal. He is communicative. Speech is not rapid and pressured, delayed, slurred or tangential.        Behavior: Behavior normal. Behavior is not agitated, slowed, aggressive, withdrawn, hyperactive or combative. Behavior is cooperative.        Thought Content: Thought content normal. Thought content is not paranoid or delusional. Thought content does not include homicidal or suicidal ideation. Thought content does not include homicidal or suicidal plan.  Cognition and Memory: Cognition and memory normal.        Judgment: Judgment normal. Judgment is not impulsive or inappropriate.    ED Results / Procedures / Treatments   Labs (all labs ordered are listed, but only abnormal results are displayed) Labs Reviewed  URINALYSIS, COMPLETE (UACMP) WITH MICROSCOPIC - Abnormal; Notable for the following components:      Result Value   Color, Urine YELLOW (*)    APPearance CLEAR (*)    Specific Gravity, Urine >1.030 (*)    Ketones, ur TRACE (*)    Leukocytes,Ua TRACE (*)    Bacteria, UA RARE (*)    All other components within normal limits  URINE CULTURE  GC/CHLAMYDIA PROBE AMP ([place]) NOT AT [facility]    EKG None  Radiology No results found.  Procedures Procedures   Medications Ordered in ED Medications  cefTRIAXone (ROCEPHIN) injection 500 mg (500 mg Intramuscular Given 05/02/21 2300)  lidocaine (PF) (XYLOCAINE) 1 % injection 1 mL (1 mL Other Given 05/02/21 2300)    ED Course  I have reviewed the triage vital signs and the nursing notes.  Pertinent labs & imaging results that were available during my care of the patient were reviewed by me and considered in my medical decision making (see chart for details).    MDM Rules/Calculators/A&P                           Patient presents to the emergency department with police under IVC.  Per IVC report, that was submitted by his mother, patient has been not eating, not drinking, not responding, losing  weight, and pulling his hair out.  On arrival in the emergency department, patient is alert, oriented, calm, cooperative.  He is able to engage in a full, lucid conversation.  He states that he has been eating normally.  He describes ordering carne asada from [employer] this morning and playing call of duty in his room.  He does live with his mother and has been staying in his room to avoid conflict with family members.  He denies pulling his hair out.  He states that he sometimes strokes his beard.  He denies weight loss and says that he wrestled at 120 pounds in high school and has never been any bigger than he is right now.  In my judgment, patient is well, mentally and physically.  He does not represent a harm to himself or others.  IVC was reversed.    On reassessment, patient was informed that he would be free to leave.  He does state that he will return back home where he lives with his mother due to not having any other good options of places to stay.  At this point, he did state that he has had penile discharge over the past month.  Urine studies were ordered.  GU exam was performed with nurse chaperone.  Patient was able to express pus from his urethral meatus.  Patient was agreeable to empiric treatment for GC and chlamydia.  IM shot of ceftriaxone given.  Patient was prescribed doxycycline.  Patient was discharged in good condition.  Final Clinical Impression(s) / ED Diagnoses Final diagnoses:  Penile discharge    Rx / DC Orders ED Discharge Orders          Ordered    doxycycline (VIBRA-TABS) 100 MG tablet  2 times daily,   Status:  Discontinued  05/02/21 2134    doxycycline (VIBRA-TABS) 100 MG tablet  2 times daily        05/02/21 2323             Gloris Manchester, MD 05/03/21 1344

## 2021-05-04 LAB — URINE CULTURE: Culture: NO GROWTH

## 2021-05-04 LAB — GC/CHLAMYDIA PROBE AMP (~~LOC~~) NOT AT ARMC
Chlamydia: POSITIVE — AB
Comment: NEGATIVE
Comment: NORMAL
Neisseria Gonorrhea: NEGATIVE

## 2021-07-24 ENCOUNTER — Emergency Department (HOSPITAL_COMMUNITY)
Admission: EM | Admit: 2021-07-24 | Discharge: 2021-07-24 | Disposition: A | Discharge status: Home / Self Care | Payer: Self-pay | Attending: Student | Admitting: Student

## 2021-07-24 ENCOUNTER — Encounter (HOSPITAL_COMMUNITY): Payer: Self-pay | Admitting: Emergency Medicine

## 2021-07-24 ENCOUNTER — Other Ambulatory Visit: Payer: Self-pay

## 2021-07-24 DIAGNOSIS — F17200 Nicotine dependence, unspecified, uncomplicated: Secondary | ICD-10-CM | POA: Insufficient documentation

## 2021-07-24 DIAGNOSIS — H60501 Unspecified acute noninfective otitis externa, right ear: Secondary | ICD-10-CM

## 2021-07-24 DIAGNOSIS — H6091 Unspecified otitis externa, right ear: Secondary | ICD-10-CM | POA: Insufficient documentation

## 2021-07-24 DIAGNOSIS — J45909 Unspecified asthma, uncomplicated: Secondary | ICD-10-CM | POA: Insufficient documentation

## 2021-07-24 MED ORDER — OFLOXACIN 0.3 % OT SOLN
10.0000 [drp] | Freq: Every day | OTIC | 0 refills | Status: AC
Start: 2021-07-24 — End: 2021-07-31

## 2021-07-24 NOTE — Discharge Instructions (Signed)
Please pick up antibiotic ear drop and use as prescribed. You can also buy OTC Flonase nasal spray to use as directed.   Take Ibuprofen and Tylenol as needed for pain  Follow up with your PCP for further evaluation. There is a 1800 number on this discharge paperwork that will help you find a PCP who accepts your insurance. If you do not have insurance you can follow up with [facility] for primary care purposes.   Return to the ED for any new/worsening symptoms

## 2021-07-24 NOTE — ED Triage Notes (Signed)
Patient complains of right ear pain that he first noticed around 0300 this morning. Patient states the pain is exacerbated by yawning. Patient alert, oriented, afebrile, and in no apparent distress at this time.

## 2021-07-24 NOTE — ED Provider Notes (Signed)
[facility] EMERGENCY DEPARTMENT Provider Note   CSN: [id number] Arrival date & time: 07/24/21  0707     History Chief Complaint  Patient presents with   Otalgia    Mark Carrillo is a 25 y.o. male who presents to the ED today with complaint of gradual onset, constant, achy, R ear pain that began at 3 AM this morning.  Patient reports that he woke up with the pain.  He states that he felt like there was pressure in his right ear and that he needed to "pop" it.  He states that he has been trying to plug his nose and increase the pressure and has had to pop his ear without success.  He states he has been trying for 3 hours without success.  He came into the ED for further evaluation.  He denies any ear discharge, muffled hearing, fevers, sore throat, cough, nasal congestion, rhinorrhea, any other associated symptoms.   The history is provided by the patient and medical records.      Past Medical History:  Diagnosis Date   Asthma     There are no problems to display for this patient.   No past surgical history on file.     No family history on file.  Social History   Tobacco Use   Smoking status: Smoker, Current Status Unknown   Smokeless tobacco: Never  Substance Use Topics   Alcohol use: No   Drug use: No    Home Medications Prior to Admission medications   Medication Sig Start Date End Date Taking? Authorizing Provider  ofloxacin (FLOXIN) 0.3 % OTIC solution Place 10 drops into the right ear daily for 7 days. 07/24/21 07/31/21 Yes Wane Mollett, PA-C  doxycycline (VIBRA-TABS) 100 MG tablet Take 1 tablet (100 mg total) by mouth 2 (two) times daily. 05/02/21   Gloris Manchester, MD  metroNIDAZOLE (FLAGYL) 250 MG tablet Take 250 mg by mouth 3 (three) times daily.    [provider]  naproxen (NAPROSYN) 250 MG tablet Take 1 tablet (250 mg total) by mouth 2 (two) times daily with a meal. 11/17/16   Everlene Farrier, PA-C    Allergies    Fish  allergy  Review of Systems   Review of Systems  Constitutional:  Negative for chills and fever.  HENT:  Positive for ear pain. Negative for congestion, ear discharge, hearing loss, rhinorrhea, sinus pressure, sinus pain and sore throat.   Respiratory:  Negative for cough.   All other systems reviewed and are negative.  Physical Exam Updated Vital Signs BP 129/87 (BP Location: Left Arm)    Pulse 73    Temp 98.2 F (36.8 C) (Oral)    Resp 14    SpO2 99%   Physical Exam Vitals and nursing note reviewed.  Constitutional:      Appearance: He is not ill-appearing or diaphoretic.  HENT:     Head: Normocephalic and atraumatic.     Right Ear: Tympanic membrane normal.     Left Ear: Tympanic membrane normal.     Ears:     Comments: Right external auditory canal slightly erythematous and edematous with TTP to tragus. Right TM clear.  Eyes:     Conjunctiva/sclera: Conjunctivae normal.  Cardiovascular:     Rate and Rhythm: Normal rate and regular rhythm.     Pulses: Normal pulses.  Pulmonary:     Effort: Pulmonary effort is normal.     Breath sounds: Normal breath sounds. No wheezing, rhonchi or rales.  Skin:    General: Skin is warm and dry.     Coloration: Skin is not jaundiced.  Neurological:     Mental Status: He is alert.    ED Results / Procedures / Treatments   Labs (all labs ordered are listed, but only abnormal results are displayed) Labs Reviewed - No data to display  EKG None  Radiology No results found.  Procedures Procedures   Medications Ordered in ED Medications - No data to display  ED Course  I have reviewed the triage vital signs and the nursing notes.  Pertinent labs & imaging results that were available during my care of the patient were reviewed by me and considered in my medical decision making (see chart for details).    MDM Rules/Calculators/A&P                          25 year old male who presents to the ED today with complaint of right  ear pain and pressure-like sensation.  On arrival to the ED today vitals are stable.  Patient is afebrile, nontachycardic and nontachypneic and appears to be in no acute distress.  He is resting comfortably at bedside.  He is noted to have some tenderness palpation to his right tragus and has slightly erythematous and edematous right external auditory canal.  His right TM is clear without signs of infection or perforation.  He denies any other URI-like symptoms at this time.  We will treat for swimmer's ear.  Instructed to follow-up with PCP for further evaluation.  He is instructed to take ibuprofen and Tylenol as needed for pain.  He is in agreement with plan and stable for discharge.   This note was prepared using Dragon voice recognition software and may include unintentional dictation errors due to the inherent limitations of voice recognition software.     Final Clinical Impression(s) / ED Diagnoses Final diagnoses:  Acute otitis externa of right ear, unspecified type    Rx / DC Orders ED Discharge Orders          Ordered    ofloxacin (FLOXIN) 0.3 % OTIC solution  Daily        07/24/21 0747             Discharge Instructions      Please pick up antibiotic ear drop and use as prescribed. You can also buy OTC Flonase nasal spray to use as directed.   Take Ibuprofen and Tylenol as needed for pain  Follow up with your PCP for further evaluation. There is a [phone] number on this discharge paperwork that will help you find a PCP who accepts your insurance. If you do not have insurance you can follow up with [facility] for primary care purposes.   Return to the ED for any new/worsening symptoms       Tanda Rockers, PA-C 07/24/21 1610    Glendora Score, MD 07/24/21 [phone]

## 2021-09-04 ENCOUNTER — Other Ambulatory Visit: Payer: Self-pay

## 2021-09-04 ENCOUNTER — Ambulatory Visit (INDEPENDENT_AMBULATORY_CARE_PROVIDER_SITE_OTHER): Payer: Self-pay | Admitting: Pulmonary Disease

## 2021-09-04 ENCOUNTER — Encounter: Payer: Self-pay | Admitting: Pulmonary Disease

## 2021-09-04 VITALS — BP 116/72 | HR 63 | Ht 70.0 in | Wt 127.0 lb

## 2021-09-04 DIAGNOSIS — Z8709 Personal history of other diseases of the respiratory system: Secondary | ICD-10-CM

## 2021-09-04 NOTE — Progress Notes (Signed)
Synopsis: Referred in January 2023 for pre-military evaluation  Subjective:   PATIENT ID: Mark Carrillo GENDER: male DOB: [birth year], MRN: [id number]   HPI  Chief Complaint  Patient presents with   Consult    Self referral to get cleared to join the air [employer]. Was diagnosed with asthma when he was 25 years old. Denies any current breathing problems.    Mark Carrillo is a 26 year old male, with history of asthma who is referred to pulmonary clinic for evaluation for military application.   He has history of asthma in childhood reported but he denies significant respiratory history since childhood. He denies shortness of breath with exertion, cough, wheezing or chest tightness. He denies any limitations with physical activity.   He denies history of smoking and no vaping. He is not working currently. Denies history of dust or chemical exposures. His brother had history of asthma.   Past Medical History:  Diagnosis Date   Asthma      Family History  Problem Relation Age of Onset   Asthma Brother      Social History   Socioeconomic History   Marital status: Single    Spouse name: Not on file   Number of children: Not on file   Years of education: Not on file   Highest education level: Not on file  Occupational History   Not on file  Tobacco Use   Smoking status: Former    Types: Cigarettes    Quit date: 06/09/2021    Years since quitting: 0.2   Smokeless tobacco: Never  Substance and Sexual Activity   Alcohol use: No   Drug use: No   Sexual activity: Not on file  Other Topics Concern   Not on file  Social History Narrative   Not on file   Social Determinants of Health   Financial Resource Strain: Not on file  Food Insecurity: Not on file  Transportation Needs: Not on file  Physical Activity: Not on file  Stress: Not on file  Social Connections: Not on file  Intimate Partner Violence: Not on file     Allergies  Allergen Reactions   Fish Allergy Anaphylaxis  and Rash     Outpatient Medications Prior to Visit  Medication Sig Dispense Refill   doxycycline (VIBRA-TABS) 100 MG tablet Take 1 tablet (100 mg total) by mouth 2 (two) times daily. 14 tablet 0   metroNIDAZOLE (FLAGYL) 250 MG tablet Take 250 mg by mouth 3 (three) times daily.     naproxen (NAPROSYN) 250 MG tablet Take 1 tablet (250 mg total) by mouth 2 (two) times daily with a meal. 30 tablet 0   No facility-administered medications prior to visit.   Review of Systems  Constitutional:  Negative for chills, fever, malaise/fatigue and weight loss.  HENT:  Negative for congestion, sinus pain and sore throat.   Eyes: Negative.   Respiratory:  Negative for cough, hemoptysis, sputum production, shortness of breath and wheezing.   Cardiovascular:  Negative for chest pain, palpitations, orthopnea, claudication and leg swelling.  Gastrointestinal:  Negative for abdominal pain, heartburn, nausea and vomiting.  Genitourinary: Negative.   Musculoskeletal:  Negative for joint pain and myalgias.  Skin:  Negative for rash.  Neurological:  Negative for weakness.  Endo/Heme/Allergies: Negative.   Psychiatric/Behavioral: Negative.     Objective:   Vitals:   09/04/21 0909  BP: 116/72  Pulse: 63  SpO2: 99%  Weight: 127 lb (57.6 kg)  Height: 5\' 10"  (1.778 m)  Physical Exam Constitutional:      General: He is not in acute distress. HENT:     Head: Normocephalic and atraumatic.  Eyes:     Extraocular Movements: Extraocular movements intact.     Conjunctiva/sclera: Conjunctivae normal.     Pupils: Pupils are equal, round, and reactive to light.  Cardiovascular:     Rate and Rhythm: Normal rate and regular rhythm.     Pulses: Normal pulses.     Heart sounds: Normal heart sounds. No murmur heard. Pulmonary:     Effort: Pulmonary effort is normal.     Breath sounds: Normal breath sounds. No wheezing, rhonchi or rales.  Abdominal:     General: Bowel sounds are normal.     Palpations:  Abdomen is soft.  Musculoskeletal:     Right lower leg: No edema.     Left lower leg: No edema.  Lymphadenopathy:     Cervical: No cervical adenopathy.  Skin:    General: Skin is warm and dry.  Neurological:     General: No focal deficit present.     Mental Status: He is alert.  Psychiatric:        Mood and Affect: Mood normal.        Behavior: Behavior normal.        Thought Content: Thought content normal.        Judgment: Judgment normal.   CBC    Component Value Date/Time   WBC 2.7 (L) 11/17/2016 1403   RBC 4.93 11/17/2016 1403   HGB 14.2 11/17/2016 1403   HCT 41.3 11/17/2016 1403   PLT 222 11/17/2016 1403   MCV 83.8 11/17/2016 1403   MCH 28.8 11/17/2016 1403   MCHC 34.4 11/17/2016 1403   RDW 12.4 11/17/2016 1403   LYMPHSABS 0.8 05/19/2016 1138   MONOABS 0.4 05/19/2016 1138   EOSABS 0.0 05/19/2016 1138   BASOSABS 0.0 05/19/2016 1138   BMP Latest Ref Rng & Units 11/17/2016 05/19/2016 04/25/2012  Glucose 65 - 99 mg/dL 96 97 94  BUN 6 - 20 mg/dL 10 16 12   Creatinine 0.61 - 1.24 mg/dL 8.46 6.59  Sodium 135 - 145 mmol/L 142 140 142  Potassium 3.5 - 5.1 mmol/L 4.2 3.6 4.0  Chloride 101 - 111 mmol/L 107 107 103  CO2 22 - 32 mmol/L 30 26 29   Calcium 8.9 - 10.3 mg/dL 9.5 8.9 9.7   Chest imaging: CXR 05/19/16 The heart size and mediastinal contours are within normal limits. Both lungs are clear. The visualized skeletal structures are unremarkable.  PFT: No flowsheet data found.  Labs:  Path:  Echo:  Heart Catheterization:  Assessment & Plan:   History of asthma - Plan: Pulmonary Function Test, CANCELED: Pulmonary Function Test  Discussion: Mark Carrillo is a 26 year old male, with history of asthma who is referred to pulmonary clinic for evaluation for military application.   We will schedule him for spirometry pre/post bronchodilator per request of his recruiter. Clinically no active signs of reactive airways disease or asthma at this time.   Follow  up as needed.  Mark Hartigan, MD [facility] Office: [phone]   No current outpatient medications on file.

## 2021-09-04 NOTE — Patient Instructions (Addendum)
We will schedule you for pulmonary function tests today and give you a call with the results.   Good luck with the [employer] and call if you have any questions in the future.

## 2021-09-05 ENCOUNTER — Encounter: Payer: Self-pay | Admitting: Pulmonary Disease

## 2021-09-07 ENCOUNTER — Telehealth: Payer: Self-pay | Admitting: Pulmonary Disease

## 2021-09-07 NOTE — Telephone Encounter (Signed)
Called and spoke with pt who stated after his visit with JD 1/27, he said that a front staff person told him to come by the office Sat 1/28 for PFT. Pt said that he came by the office Sat. 1/28 and realized that nobody was at the office. Stated to pt that we are only open Mon-Fri and are closed on the weekends. Stated to pt that he is scheduled for PFT 2/3 and that is the soonest appt avail and he verbalized understanding. Nothing further needed.

## 2021-09-11 ENCOUNTER — Ambulatory Visit: Payer: Self-pay

## 2021-09-11 ENCOUNTER — Other Ambulatory Visit: Payer: Self-pay

## 2021-09-11 DIAGNOSIS — Z8709 Personal history of other diseases of the respiratory system: Secondary | ICD-10-CM

## 2021-09-11 LAB — PULMONARY FUNCTION TEST
FEF 25-75 Post: 5.61 L/sec
FEF 25-75 Pre: 4.75 L/sec
FEF2575-%Change-Post: 18 %
FEF2575-%Pred-Post: 133 %
FEF2575-%Pred-Pre: 112 %
FEV1-%Change-Post: 3 %
FEV1-%Pred-Post: 125 %
FEV1-%Pred-Pre: 120 %
FEV1-Post: 4.71 L
FEV1-Pre: 4.56 L
FEV1FVC-%Change-Post: 3 %
FEV1FVC-%Pred-Pre: 98 %
FEV6-%Change-Post: 1 %
FEV6-%Pred-Post: 124 %
FEV6-%Pred-Pre: 122 %
FEV6-Post: 5.5 L
FEV6-Pre: 5.43 L
FEV6FVC-%Change-Post: 1 %
FEV6FVC-%Pred-Post: 101 %
FEV6FVC-%Pred-Pre: 99 %
FVC-%Change-Post: 0 %
FVC-%Pred-Post: 123 %
FVC-%Pred-Pre: 122 %
FVC-Post: 5.5 L
FVC-Pre: 5.49 L
Post FEV1/FVC ratio: 86 %
Post FEV6/FVC ratio: 100 %
Pre FEV1/FVC ratio: 83 %
Pre FEV6/FVC Ratio: 99 %

## 2021-09-14 ENCOUNTER — Telehealth: Payer: Self-pay | Admitting: Pulmonary Disease

## 2021-09-14 NOTE — Telephone Encounter (Signed)
Please let patient know that his breathing tests are normal.   We can mail him a copy of the recent office visit note along with PFT results or he can come in to pick up copies. 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  Thanks, JD

## 2021-09-14 NOTE — Telephone Encounter (Signed)
Called and spoke with patient. He verbalized understanding of results. He will stop by the office tomorrow to pick up a copy of the office note as well as the PFT. Advised him that I will place these up front at the desk.   Nothing further needed at time of call.

## 2021-10-02 ENCOUNTER — Telehealth: Payer: Self-pay | Admitting: Pulmonary Disease

## 2021-10-02 NOTE — Telephone Encounter (Signed)
Called the pt and he did not answer- phone picked up and sounded like a fax machine ringing. Called back and call would not go through. Will call back later. I went ahead and printed the ov note and PFT.

## 2021-10-05 NOTE — Telephone Encounter (Signed)
Spoke with the pt  He is aware that the PFT and ov note placed up front for pick up  Nothing further needed
